# Patient Record
Sex: Female | Born: 1960 | Race: White | Hispanic: No | Marital: Married | Smoking: Never smoker
Health system: Southern US, Community
[De-identification: ages and names within clinical notes are randomized; demographics above are authoritative.]

## PROBLEM LIST (undated history)

## (undated) DIAGNOSIS — M1990 Unspecified osteoarthritis, unspecified site: Secondary | ICD-10-CM

## (undated) HISTORY — PX: LITHOTRIPSY: SUR834

---

## 2014-01-01 DIAGNOSIS — F5101 Primary insomnia: Secondary | ICD-10-CM | POA: Insufficient documentation

## 2014-01-01 DIAGNOSIS — M545 Low back pain, unspecified: Secondary | ICD-10-CM | POA: Insufficient documentation

## 2014-01-01 DIAGNOSIS — G8929 Other chronic pain: Secondary | ICD-10-CM | POA: Insufficient documentation

## 2014-02-02 DIAGNOSIS — E7800 Pure hypercholesterolemia, unspecified: Secondary | ICD-10-CM | POA: Insufficient documentation

## 2014-02-14 DIAGNOSIS — Z0189 Encounter for other specified special examinations: Secondary | ICD-10-CM | POA: Insufficient documentation

## 2014-04-24 ENCOUNTER — Ambulatory Visit: Payer: Self-pay | Admitting: Family Medicine

## 2014-05-21 ENCOUNTER — Ambulatory Visit: Payer: Self-pay | Admitting: Family Medicine

## 2014-06-22 DIAGNOSIS — D126 Benign neoplasm of colon, unspecified: Secondary | ICD-10-CM | POA: Insufficient documentation

## 2014-08-14 ENCOUNTER — Ambulatory Visit: Payer: Self-pay | Admitting: Family Medicine

## 2014-08-15 DIAGNOSIS — M818 Other osteoporosis without current pathological fracture: Secondary | ICD-10-CM | POA: Insufficient documentation

## 2016-09-17 ENCOUNTER — Other Ambulatory Visit: Payer: Self-pay | Admitting: Orthopedic Surgery

## 2016-09-17 DIAGNOSIS — M1712 Unilateral primary osteoarthritis, left knee: Secondary | ICD-10-CM

## 2016-09-17 DIAGNOSIS — M2392 Unspecified internal derangement of left knee: Secondary | ICD-10-CM

## 2016-09-17 DIAGNOSIS — M2352 Chronic instability of knee, left knee: Secondary | ICD-10-CM

## 2016-10-14 ENCOUNTER — Ambulatory Visit
Admission: RE | Admit: 2016-10-14 | Discharge: 2016-10-14 | Disposition: A | Discharge status: Home / Self Care | Payer: BLUE CROSS/BLUE SHIELD | Source: Ambulatory Visit | Attending: Orthopedic Surgery | Admitting: Orthopedic Surgery

## 2016-10-14 DIAGNOSIS — M7122 Synovial cyst of popliteal space [Baker], left knee: Secondary | ICD-10-CM | POA: Diagnosis not present

## 2016-10-14 DIAGNOSIS — X58XXXA Exposure to other specified factors, initial encounter: Secondary | ICD-10-CM | POA: Diagnosis not present

## 2016-10-14 DIAGNOSIS — M1712 Unilateral primary osteoarthritis, left knee: Secondary | ICD-10-CM | POA: Diagnosis present

## 2016-10-14 DIAGNOSIS — M25462 Effusion, left knee: Secondary | ICD-10-CM | POA: Diagnosis not present

## 2016-10-14 DIAGNOSIS — S83232A Complex tear of medial meniscus, current injury, left knee, initial encounter: Secondary | ICD-10-CM | POA: Diagnosis not present

## 2016-10-14 DIAGNOSIS — M2392 Unspecified internal derangement of left knee: Secondary | ICD-10-CM

## 2016-10-14 DIAGNOSIS — M2352 Chronic instability of knee, left knee: Secondary | ICD-10-CM | POA: Diagnosis present

## 2016-11-10 DIAGNOSIS — M23204 Derangement of unspecified medial meniscus due to old tear or injury, left knee: Secondary | ICD-10-CM | POA: Insufficient documentation

## 2016-11-10 DIAGNOSIS — M25562 Pain in left knee: Secondary | ICD-10-CM | POA: Insufficient documentation

## 2016-11-20 ENCOUNTER — Other Ambulatory Visit: Payer: Self-pay | Admitting: Family Medicine

## 2016-11-20 DIAGNOSIS — Z1239 Encounter for other screening for malignant neoplasm of breast: Secondary | ICD-10-CM

## 2016-12-01 ENCOUNTER — Encounter: Payer: Self-pay | Admitting: Anesthesiology

## 2016-12-04 ENCOUNTER — Ambulatory Visit: Payer: BLUE CROSS/BLUE SHIELD | Admitting: Anesthesiology

## 2016-12-04 ENCOUNTER — Ambulatory Visit
Admission: RE | Admit: 2016-12-04 | Discharge: 2016-12-04 | Disposition: A | Discharge status: Home / Self Care | Payer: BLUE CROSS/BLUE SHIELD | Source: Ambulatory Visit | Attending: Unknown Physician Specialty | Admitting: Unknown Physician Specialty

## 2016-12-04 ENCOUNTER — Encounter
Admission: RE | Disposition: A | Discharge status: Home / Self Care | Payer: Self-pay | Source: Ambulatory Visit | Attending: Unknown Physician Specialty

## 2016-12-04 DIAGNOSIS — X58XXXA Exposure to other specified factors, initial encounter: Secondary | ICD-10-CM | POA: Diagnosis not present

## 2016-12-04 DIAGNOSIS — Y929 Unspecified place or not applicable: Secondary | ICD-10-CM | POA: Diagnosis not present

## 2016-12-04 DIAGNOSIS — S83242A Other tear of medial meniscus, current injury, left knee, initial encounter: Secondary | ICD-10-CM | POA: Insufficient documentation

## 2016-12-04 HISTORY — PX: MENISECTOMY: SHX5181

## 2016-12-04 HISTORY — PX: KNEE ARTHROSCOPY: SHX127

## 2016-12-04 HISTORY — DX: Unspecified osteoarthritis, unspecified site: M19.90

## 2016-12-04 SURGERY — ARTHROSCOPY, KNEE
Anesthesia: General | Laterality: Left | Wound class: Clean

## 2016-12-04 MED ORDER — LIDOCAINE HCL (CARDIAC) 20 MG/ML IV SOLN
INTRAVENOUS | Status: DC | PRN
  Administered 2016-12-04: 50 mg via INTRATRACHEAL

## 2016-12-04 MED ORDER — OXYCODONE HCL 5 MG/5ML PO SOLN: 5.0000 mg | Freq: Once | ORAL | Status: AC | PRN

## 2016-12-04 MED ORDER — PROPOFOL 10 MG/ML IV BOLUS
INTRAVENOUS | Status: DC | PRN
  Administered 2016-12-04: 200 mg via INTRAVENOUS

## 2016-12-04 MED ORDER — SCOPOLAMINE 1 MG/3DAYS TD PT72
1.0000 | MEDICATED_PATCH | Freq: Once | TRANSDERMAL | Status: DC
  Administered 2016-12-04: 1.5 mg via TRANSDERMAL

## 2016-12-04 MED ORDER — ONDANSETRON HCL 4 MG/2ML IJ SOLN
INTRAMUSCULAR | Status: DC | PRN
  Administered 2016-12-04: 4 mg via INTRAVENOUS

## 2016-12-04 MED ORDER — DEXAMETHASONE SODIUM PHOSPHATE 4 MG/ML IJ SOLN
INTRAMUSCULAR | Status: DC | PRN
  Administered 2016-12-04: 4 mg via INTRAVENOUS

## 2016-12-04 MED ORDER — ACETAMINOPHEN 10 MG/ML IV SOLN
1000.0000 mg | Freq: Once | INTRAVENOUS | Status: AC
  Administered 2016-12-04: 1000 mg via INTRAVENOUS

## 2016-12-04 MED ORDER — FENTANYL CITRATE (PF) 100 MCG/2ML IJ SOLN
25.0000 ug | INTRAMUSCULAR | Status: DC | PRN
Start: 2016-12-04 — End: 2016-12-04

## 2016-12-04 MED ORDER — NORCO 5-325 MG PO TABS: 1.0000 | ORAL_TABLET | ORAL | 0 refills | Status: DC | PRN

## 2016-12-04 MED ORDER — ROPIVACAINE HCL 5 MG/ML IJ SOLN
INTRAMUSCULAR | Status: DC | PRN
  Administered 2016-12-04: 19 mL

## 2016-12-04 MED ORDER — MIDAZOLAM HCL 5 MG/5ML IJ SOLN
INTRAMUSCULAR | Status: DC | PRN
  Administered 2016-12-04: 2 mg via INTRAVENOUS

## 2016-12-04 MED ORDER — FENTANYL CITRATE (PF) 100 MCG/2ML IJ SOLN
INTRAMUSCULAR | Status: DC | PRN
  Administered 2016-12-04 (×2): 50 ug via INTRAVENOUS

## 2016-12-04 MED ORDER — ONDANSETRON HCL 4 MG/2ML IJ SOLN: 4.0000 mg | Freq: Once | INTRAMUSCULAR | Status: DC | PRN

## 2016-12-04 MED ORDER — OXYCODONE HCL 5 MG PO TABS
5.0000 mg | ORAL_TABLET | Freq: Once | ORAL | Status: AC | PRN
  Administered 2016-12-04: 5 mg via ORAL

## 2016-12-04 MED ORDER — LACTATED RINGERS IV SOLN
INTRAVENOUS | Status: DC
  Administered 2016-12-04: 09:00:00 via INTRAVENOUS

## 2016-12-04 SURGICAL SUPPLY — 52 items
ARTHROWAND PARAGON T2 (SURGICAL WAND)
BLADE ABRADER 4.5 (BLADE) ×4 IMPLANT
BLADE FULL RADIUS 3.5 (BLADE) ×4 IMPLANT
BLADE SHAVER 4.5X7 STR FR (MISCELLANEOUS) IMPLANT
BLADE SHAVER AGGRES 5.5  STR (CUTTER)
BLADE SHAVER AGGRES 5.5 STR (CUTTER) IMPLANT
BNDG ESMARK 6X12 TAN STRL LF (GAUZE/BANDAGES/DRESSINGS) IMPLANT
BUR 5.5 NOTCHBLASTER STR (BURR) IMPLANT
BUR ABRADER 4.0 W/FLUTE AQUA (MISCELLANEOUS) IMPLANT
BUR ABRADER 5.5 BLK (MISCELLANEOUS) IMPLANT
BUR ACROMIONIZER 4.0 (BURR) IMPLANT
BUR BR 5.5 WIDE MOUTH (BURR) IMPLANT
BUR RADIUS 3.5 (BURR) IMPLANT
BUR RADIUS 4.0X18.5 (BURR) IMPLANT
BUR ROUND 5.5 (BURR) IMPLANT
BURR 5.5 NOTCHBLASTER STR (BURR)
BURR ABRADER 4.0 W/FLUTE AQUA (MISCELLANEOUS)
BURR ABRADER 5.5 BLK (MISCELLANEOUS)
BURR ROUND 12 FLUTE 4.0MM (BURR) IMPLANT
CANN 8.5MMX72MM THREADED (CANNULA)
CANNULA THRD 8.5X72 DISP (CANNULA) IMPLANT
COVER LIGHT HANDLE UNIVERSAL (MISCELLANEOUS) ×8 IMPLANT
CUFF TOURN SGL QUICK 30 (MISCELLANEOUS)
CUFF TOURN SGL QUICK 34 (TOURNIQUET CUFF)
CUFF TRNQT CYL 34X4X40X1 (TOURNIQUET CUFF) IMPLANT
CUFF TRNQT CYL LO 30X4X (MISCELLANEOUS) IMPLANT
CUTTER SLOTTED WHISKER 4.0 (BURR) IMPLANT
DRAPE LEGGINS SURG 28X43 STRL (DRAPES) ×4 IMPLANT
DURAPREP 26ML APPLICATOR (WOUND CARE) ×4 IMPLANT
GAUZE SPONGE 4X4 12PLY STRL (GAUZE/BANDAGES/DRESSINGS) ×4 IMPLANT
GLOVE BIO SURGEON STRL SZ7.5 (GLOVE) ×4 IMPLANT
GLOVE BIO SURGEON STRL SZ8 (GLOVE) ×4 IMPLANT
GLOVE INDICATOR 8.0 STRL GRN (GLOVE) ×4 IMPLANT
GOWN STRL REIN 2XL XLG LVL4 (GOWN DISPOSABLE) ×8 IMPLANT
GOWN STRL REUS W/TWL 2XL LVL3 (GOWN DISPOSABLE) IMPLANT
IV LACTATED RINGER IRRG 3000ML (IV SOLUTION) ×4
IV LR IRRIG 3000ML ARTHROMATIC (IV SOLUTION) ×4 IMPLANT
KIT ROOM TURNOVER OR (KITS) ×4 IMPLANT
MANIFOLD 4PT FOR NEPTUNE1 (MISCELLANEOUS) ×4 IMPLANT
PACK ARTHROSCOPY KNEE (MISCELLANEOUS) ×4 IMPLANT
SET TUBE SUCT SHAVER OUTFL 24K (TUBING) ×4 IMPLANT
SOL PREP PVP 2OZ (MISCELLANEOUS) ×4
SOLUTION PREP PVP 2OZ (MISCELLANEOUS) ×2 IMPLANT
SUT ETHILON 3-0 FS-10 30 BLK (SUTURE) ×4
SUTURE EHLN 3-0 FS-10 30 BLK (SUTURE) ×2 IMPLANT
TAPE MICROFOAM 4IN (TAPE) ×4 IMPLANT
TUBING ARTHRO INFLOW-ONLY STRL (TUBING) ×4 IMPLANT
WAND ARTHRO PARAGON T2 (SURGICAL WAND) IMPLANT
WAND COBLATION FLOW 50 (SURGICAL WAND) ×4 IMPLANT
WAND HAND CNTRL MULTIVAC 50 (MISCELLANEOUS) IMPLANT
WAND HAND CNTRL MULTIVAC 90 (MISCELLANEOUS) IMPLANT
WAND MEGAVAC 90 (MISCELLANEOUS) IMPLANT

## 2016-12-04 NOTE — Op Note (Signed)
Patient: Misty Wilcox, Misty Wilcox.  Preoperative diagnosis: Torn medial meniscus left knee  Postop diagnosis: Same plus grade 2 medial femoral chondral changes  Operation: Arthroscopic partial medial meniscectomy left knee plus chondral debridement  Surgeon: Vilinda Flake, MD  Anesthesia: Gen.   History: Patient's had a long history of left knee pain.  The plain films revealed minimal medial compartment joint space narrowing .  The patient had an MRI which revealed a complex tear of the posterior horn of the medial meniscus.The patient was scheduled for surgery due to persistent discomfort despite conservative treatment.  The patient was taken the operating room where satisfactory general anesthesia was achieved. A tourniquet and leg holder were was applied to the left thigh. A well leg support was applied to the nonoperative extremity. The left knee was prepped and draped in usual fashion for an arthroscopic procedure. An inflow cannula was introduced superomedially. The joint was distended with lactated Ringer's. Scope was introduced through an inferolateral puncture wound and a probe through an inferomedial puncture wound. Inspection of the medial compartment revealed a complex tear of the posterior horn of the medial meniscus with a flap component. I went ahead and resected the tear using a combination of a motorized resector and basket biter. The remaining rim was contoured with an angled ArthroCare radiofrequency wand. There were some grade 2 medial femoral chondral changes that were debrided with a turbo whisker and then I coblated the more significant fibrillated areas with an ArthroCare radiofrequency wand. Inspection of the intercondylar notch revealed intact cruciates. Inspection of the the lateral compartment revealed no meniscal or chondral pathology.   Trochlear groove was inspected and appeared to be fairly smooth.  Inspection of the retropatellar surface revealed no significant  chondral pathology. I observed patella tracking from the inferolateral portal. The patella seemed to track fairly well.  The instruments were removed from the joint at this time. The puncture wounds were closed with 3-0 nylon in vertical mattress fashion. I injected each puncture wound with several cc of half percent Marcaine without epinephrine. Betadine was applied to the wounds followed by sterile dressing. An ice pack was applied to the right knee. The patient was awakened and transferred to the stretcher bed. The patient was taken to the recovery room in satisfactory condition.  The tourniquet was not inflated during the course of the procedure. Blood loss was negligible.

## 2016-12-04 NOTE — Anesthesia Postprocedure Evaluation (Signed)
Anesthesia Post Note  Patient: Aneta Mins Carley  Procedure(s) Performed: Procedure(s) (LRB): ARTHROSCOPY KNEE LEFT (Left) MENISECTOMY, partial  Patient location during evaluation: PACU Anesthesia Type: General Level of consciousness: awake and alert and oriented Pain management: satisfactory to patient Vital Signs Assessment: post-procedure vital signs reviewed and stable Respiratory status: spontaneous breathing, nonlabored ventilation and respiratory function stable Cardiovascular status: blood pressure returned to baseline and stable Postop Assessment: Adequate PO intake and No signs of nausea or vomiting Anesthetic complications: no    Raliegh Ip

## 2016-12-04 NOTE — Discharge Instructions (Signed)
[facility]  Kathrene Alu., M.D. [phone]   KNEE ARTHROSCOPY POST OPERATION INSTRUCTIONS:  PLEASE READ THESE INSTRUCTIONS ABOUT POST OPERATION CARE. THEY WILL ANSWER MOST OF YOUR QUESTIONS.  You have been given a prescription for pain. Please take as directed for pain.  You can walk, keeping the knee slightly stiff-avoid doing too much bending the first day. (if ACL reconstruction is performed, keep brace locked in extension when walking.)  You will use crutches or cane if needed. Can weight bear as tolerated  Plan to take three to four days off from work. You can resume work when you are comfortable. (This can be a week or more, depending on the type of work you do.)  To reduce pain and swelling, place one to two pillows under the knee the first two or three days when sitting or lying. An ice pack may be placed on top of the area over the dressing. Instructions for making homemade icepack are as follow:  Flexible homemade alcohol water ice pack  2 cups water  1 cup rubbing alcohol  food coloring for the blue tint (optional)  2 zip-top bags - gallon-size  Mix the water and alcohol together in one of your zip-top bags and add food coloring. Release as much air as possible and seal the bag. Place in freezer for at least 12 hours.  The small incisions in your knee are closed with nylon stitches. They will be removed in the office.  The bulky dressing may be removed in the third day after surgery. (If ACL surgery-DO NOT REMOVE BANDAGES). Put a waterproof band-aid over each stitch. Do not put any creams or ointments on wounds. You may shower at this time, but change waterproof band-aids after showering. KEEP INCISIONS CLEAN AND DRY UNTIL YOU RETURN TO THE OFFICE.  Sometimes the operative area remains somewhat painful and swollen for several weeks. This is usually nothing to worry about, but call if you have any excessive symptoms, especially  fever. It is not unusual to have a low grade fever of 99 degrees for the first few days. If persist after 3-4 days call the office. It is not uncommon for the pain to be a little worse on the third day after surgery.  Begin doing gentle exercises right away. They will be limited by the amount of pain and swelling you have.  Exercising will reduce the swelling, increase motion, and prevent muscle weakness. Exercises: Straight leg raising and gentle knee bending.  Take a 325 milligram aspirin or Bufferin tablet twice a day for 2 weeks after meals or milk. This along with elevation will help reduce the possibility of phlebitis in your operated leg.  Avoid strenuous athletics for a minimum of 4 to 6 weeks after arthroscopic surgery (approximately five months if ACL surgery).  If the surgery included ACL reconstruction the brace that is supplied to the extremity post surgery is to be locked in extension when you are asleep and is to be locked in extension when you are ambulating. It can be unlocked for exercises or sitting.  Keep your post surgery appointment that has been made for you. If you do not remember the date call [phone]. Your follow up appointment should be between 7-10 days.  General Anesthesia, Adult, Care After These instructions provide you with information about caring for yourself after your procedure. Your health care provider may also give you more specific instructions. Your treatment has been planned according to current  medical practices, but problems sometimes occur. Call your health care provider if you have any problems or questions after your procedure. What can I expect after the procedure? After the procedure, it is common to have:  Vomiting.  A sore throat.  Mental slowness.  It is common to feel:  Nauseous.  Cold or shivery.  Sleepy.  Tired.  Sore or achy, even in parts of your body where you did not have surgery.  Follow these instructions at home: For at  least 24 hours after the procedure:  Do not: ? Participate in activities where you could fall or become injured. ? Drive. ? Use heavy machinery. ? Drink alcohol. ? Take sleeping pills or medicines that cause drowsiness. ? Make important decisions or sign legal documents. ? Take care of children on your own.  Rest. Eating and drinking  If you vomit, drink water, juice, or soup when you can drink without vomiting.  Drink enough fluid to keep your urine clear or pale yellow.  Make sure you have little or no nausea before eating solid foods.  Follow the diet recommended by your health care provider. General instructions  Have a responsible adult stay with you until you are awake and alert.  Return to your normal activities as told by your health care provider. Ask your health care provider what activities are safe for you.  Take over-the-counter and prescription medicines only as told by your health care provider.  If you smoke, do not smoke without supervision.  Keep all follow-up visits as told by your health care provider. This is important. Contact a health care provider if:  You continue to have nausea or vomiting at home, and medicines are not helpful.  You cannot drink fluids or start eating again.  You cannot urinate after 8-12 hours.  You develop a skin rash.  You have fever.  You have increasing redness at the site of your procedure. Get help right away if:  You have difficulty breathing.  You have chest pain.  You have unexpected bleeding.  You feel that you are having a life-threatening or urgent problem. This information is not intended to replace advice given to you by your health care provider. Make sure you discuss any questions you have with your health care provider. Document Released: 08/10/2000 Document Revised: 10/07/2015 Document Reviewed: 04/18/2015 Elsevier Interactive Patient Education  2018 [employer].  Scopolamine skin  patches REMOVE PATCH IN 72 HOURS AND [place] HANDS IMMEDIATELY.  What is this medicine? SCOPOLAMINE (skoe POL a meen) is used to prevent nausea and vomiting caused by motion sickness, anesthesia and surgery. This medicine may be used for other purposes; ask your health care provider or pharmacist if you have questions. COMMON BRAND NAME(S): Transderm Scop What should I tell my health care provider before I take this medicine? They need to know if you have any of these conditions: -glaucoma -kidney or liver disease -an unusual or allergic reaction (especially skin allergy) to scopolamine, atropine, other medicines, foods, dyes, or preservatives -pregnant or trying to get pregnant -breast-feeding How should I use this medicine? This medicine is for external use only. Follow the directions on the prescription label. One patch contains enough medicine to prevent motion sickness for up to 3 days. Apply the patch at least 4 hours before you need it and only wear one disc at a time. Choose an area behind the ear, that is clean, dry, hairless and free from any cuts or irritation. Wipe the area with a  clean dry tissue. Peel off the plastic backing of the skin patch, trying not to touch the adhesive side with your hands. Do not cut the patches. Firmly apply to the area you have chosen, with the metallic side of the patch to the skin and the tan-colored side showing. Once firmly in place, wash your hands well with soap and water. Remove the disc after 3 days, or sooner if you no longer need it. After removing the patch, wash your hands and the area behind your ear thoroughly with soap and water. The patch will still contain some medicine after use. To avoid accidental contact or ingestion by children or pets, fold the used patch in half with the sticky side together and throw away in the trash out of the reach of children and pets. If you need to use a second patch after you remove the first, place it behind the  other ear. Talk to your pediatrician regarding the use of this medicine in children. Special care may be needed. Overdosage: If you think you have taken too much of this medicine contact a poison control center or emergency room at once. NOTE: This medicine is only for you. Do not share this medicine with others. What if I miss a dose? Make sure you apply the patch at least 4 hours before you need it. You can apply it the night before traveling. What may interact with this medicine? -benztropine -bethanechol -medicines for anxiety or sleeping problems like diazepam or temazepam -medicines for hay fever and other allergies -medicines for mental depression -muscle relaxants This list may not describe all possible interactions. Give your health care provider a list of all the medicines, herbs, non-prescription drugs, or dietary supplements you use. Also tell them if you smoke, drink alcohol, or use illegal drugs. Some items may interact with your medicine. What should I watch for while using this medicine? Keep the patch dry, if possible, to prevent it from falling off. Limited contact with water, however, as in bathing or swimming, will not affect the system. If the patch falls off, throw it away and put a new one behind the other ear. You may get drowsy or dizzy. Do not drive, use machinery, or do anything that needs mental alertness until you know how this medicine affects you. Do not stand or sit up quickly, especially if you are an older patient. This reduces the risk of dizzy or fainting spells. Alcohol may interfere with the effect of this medicine. Avoid alcoholic drinks. Your mouth may get dry. Chewing sugarless gum or sucking hard candy, and drinking plenty of water may help. Contact your doctor if the problem does not go away or is severe. This medicine may cause dry eyes and blurred vision. If you wear contact lenses you may feel some discomfort. Lubricating drops may help. See your eye  doctor if the problem does not go away or is severe. If you are going to have a magnetic resonance imaging (MRI) procedure, tell your MRI technician if you have this patch on your body. It must be removed before a MRI. What side effects may I notice from receiving this medicine? Side effects that you should report to your doctor or health care professional as soon as possible: -agitation, nervousness, confusion -blurred vision and other eye problems -dizziness, drowsiness -eye pain or redness in the whites of the eye -hallucinations -pain or difficulty passing urine -skin rash, itching -vomiting Side effects that usually do not require medical attention (report to  your doctor or health care professional if they continue or are bothersome): -headache -nausea This list may not describe all possible side effects. Call your doctor for medical advice about side effects. You may report side effects to FDA at 1-800-FDA-1088. Where should I keep my medicine? Keep out of the reach of children. Store at room temperature between 20 and 25 degrees C (68 and 77 degrees F). Throw away any unused medicine after the expiration date. When you remove a patch, fold it and throw it in the trash as described above. NOTE: This sheet is a summary. It may not cover all possible information. If you have questions about this medicine, talk to your doctor, pharmacist, or health care provider.  2018 Elsevier/Gold Standard (2011-10-01 13:31:48)

## 2016-12-04 NOTE — H&P (Signed)
  H and P reviewed. No changes. Uploaded at later date. 

## 2016-12-04 NOTE — Anesthesia Procedure Notes (Signed)
Procedure Name: LMA Insertion Date/Time: 12/04/2016 9:23 AM Performed by: Londell Moh Pre-anesthesia Checklist: Patient identified, Emergency Drugs available, Suction available, Timeout performed and Patient being monitored Patient Re-evaluated:Patient Re-evaluated prior to induction Oxygen Delivery Method: Circle system utilized Preoxygenation: Pre-oxygenation with 100% oxygen Induction Type: IV induction LMA: LMA inserted LMA Size: 4.0 Number of attempts: 1 Placement Confirmation: positive ETCO2 and breath sounds checked- equal and bilateral Tube secured with: Tape

## 2016-12-04 NOTE — Anesthesia Preprocedure Evaluation (Signed)
Anesthesia Evaluation  Patient identified by MRN, date of birth, ID band Patient awake    Reviewed: Allergy & Precautions, H&P , NPO status , Patient's Chart, lab work & pertinent test results  Airway Mallampati: II  TM Distance: >3 FB Neck ROM: full    Dental no notable dental hx.    Pulmonary    Pulmonary exam normal breath sounds clear to auscultation       Cardiovascular Normal cardiovascular exam Rhythm:regular Rate:Normal     Neuro/Psych    GI/Hepatic   Endo/Other    Renal/GU      Musculoskeletal   Abdominal   Peds  Hematology   Anesthesia Other Findings   Reproductive/Obstetrics                             Anesthesia Physical Anesthesia Plan  ASA: II  Anesthesia Plan: General   Post-op Pain Management:    Induction:   PONV Risk Score and Plan: 3 and Ondansetron, Dexamethasone, Propofol, Midazolam and Scopolamine patch - Pre-op  Airway Management Planned:   Additional Equipment:   Intra-op Plan:   Post-operative Plan:   Informed Consent: I have reviewed the patients History and Physical, chart, labs and discussed the procedure including the risks, benefits and alternatives for the proposed anesthesia with the patient or authorized representative who has indicated his/her understanding and acceptance.     Plan Discussed with: CRNA  Anesthesia Plan Comments:         Anesthesia Quick Evaluation

## 2016-12-04 NOTE — Transfer of Care (Signed)
Immediate Anesthesia Transfer of Care Note  Patient: Misty Wilcox  Procedure(s) Performed: Procedure(s) with comments: ARTHROSCOPY KNEE LEFT (Left) - ROB BAGWELL MENISECTOMY, partial  Patient Location: PACU  Anesthesia Type: General  Level of Consciousness: awake, alert  and patient cooperative  Airway and Oxygen Therapy: Patient Spontanous Breathing and Patient connected to supplemental oxygen  Post-op Assessment: Post-op Vital signs reviewed, Patient's Cardiovascular Status Stable, Respiratory Function Stable, Patent Airway and No signs of Nausea or vomiting  Post-op Vital Signs: Reviewed and stable  Complications: No apparent anesthesia complications

## 2016-12-07 ENCOUNTER — Encounter: Payer: Self-pay | Admitting: Unknown Physician Specialty

## 2017-06-09 ENCOUNTER — Other Ambulatory Visit: Payer: Self-pay | Admitting: Medical Oncology

## 2017-06-09 DIAGNOSIS — Z780 Asymptomatic menopausal state: Secondary | ICD-10-CM

## 2018-07-18 ENCOUNTER — Other Ambulatory Visit: Payer: Self-pay

## 2018-07-18 ENCOUNTER — Ambulatory Visit
Admission: EM | Admit: 2018-07-18 | Discharge: 2018-07-18 | Disposition: A | Discharge status: Home / Self Care | Payer: BLUE CROSS/BLUE SHIELD | Attending: Family Medicine | Admitting: Family Medicine

## 2018-07-18 ENCOUNTER — Encounter: Payer: Self-pay | Admitting: Emergency Medicine

## 2018-07-18 DIAGNOSIS — N3001 Acute cystitis with hematuria: Secondary | ICD-10-CM | POA: Diagnosis not present

## 2018-07-18 LAB — URINALYSIS, COMPLETE (UACMP) WITH MICROSCOPIC
Bilirubin Urine: NEGATIVE
Glucose, UA: NEGATIVE mg/dL
NITRITE: NEGATIVE
Protein, ur: 100 mg/dL — AB
RBC / HPF: >50 RBC/hpf (ref 0–5)
SPECIFIC GRAVITY, URINE: 1.02 (ref 1.005–1.030)
Squamous Epithelial / LPF: NONE SEEN (ref 0–5)
WBC, UA: >50 WBC/hpf (ref 0–5)
pH: 6 (ref 5.0–8.0)

## 2018-07-18 MED ORDER — CEPHALEXIN 500 MG PO CAPS: 500.0000 mg | ORAL_CAPSULE | Freq: Two times a day (BID) | ORAL | 0 refills | Status: DC

## 2018-07-18 NOTE — Discharge Instructions (Signed)
Antibiotic as prescribed.  Take care  Dr. Elverta Dimiceli  

## 2018-07-18 NOTE — ED Provider Notes (Signed)
[facility]    CSN: [id number] Arrival date & time: 07/18/18  0901  History   Chief Complaint Chief Complaint  Patient presents with  . Dysuria   HPI  58 year old female presents with concerns of UTI.  Patient reports that she has had burning with urination for the past 4 days.  She states this started on Thursday.  She reports urinary urgency and frequency.  Patient reports that she has increased her water intake and has been drinking cranberry juice with improvement.  However, her symptoms have not fully resolved.  She reports that she has some abdominal pain but this is now resolved.  No back pain.  No known exacerbating factors.  No other associated symptoms.  No other complaints.  PMH, Surgical Hx, Family Hx, Social History reviewed and updated as below.  Past Medical History:  Diagnosis Date  . Arthritis    left knee   Past Surgical History:  Procedure Laterality Date  . CESAREAN SECTION    . KNEE ARTHROSCOPY Left 12/04/2016   Procedure: ARTHROSCOPY KNEE LEFT;  Surgeon: Leanor Kail, MD;  Location: [place];  Service: Orthopedics;  Laterality: Left;  ROB BAGWELL  . LITHOTRIPSY     58 years of age  . MENISECTOMY  12/04/2016   Procedure: MENISECTOMY, partial;  Surgeon: Leanor Kail, MD;  Location: [place];  Service: Orthopedics;;   OB History   No obstetric history on file.    Home Medications    Prior to Admission medications   Medication Sig Start Date End Date Taking? Authorizing Provider  cephALEXin (KEFLEX) 500 MG capsule Take 1 capsule (500 mg total) by mouth 2 (two) times daily. 07/18/18   Coral Spikes, DO  Ergocalciferol (VITAMIN D2 PO) Take 1 tablet by mouth.    [provider]    Family History Family History  Problem Relation Age of Onset  . Cancer Mother        brain    Social History Social History   Tobacco Use  . Smoking status: Never Smoker  . Smokeless tobacco: Never Used  Substance Use  Topics  . Alcohol use: Yes    Alcohol/week: 3.0 standard drinks    Types: 3 Glasses of wine per week    Comment: per week  . Drug use: Not on file     Allergies   Patient has no known allergies.   Review of Systems Review of Systems  Constitutional: Negative for fever.  Gastrointestinal: Positive for abdominal pain.  Genitourinary: Positive for dysuria, frequency and urgency. Negative for flank pain.  Musculoskeletal: Negative for back pain.   Physical Exam Triage Vital Signs ED Triage Vitals  Enc Vitals Group     BP 07/18/18 0931 124/75     Pulse Rate 07/18/18 0931 70     Resp 07/18/18 0931 16     Temp 07/18/18 0931 98.5 F (36.9 C)     Temp Source 07/18/18 0931 Oral     SpO2 07/18/18 0931 99 %     Weight 07/18/18 0928 165 lb (74.8 kg)     Height 07/18/18 0928 4\' 11"  (1.499 m)     Head Circumference --      Peak Flow --      Pain Score 07/18/18 0928 0     Pain Loc --      Pain Edu? --      Excl. in [place]? --    Updated Vital Signs BP 124/75 (BP Location: Left Arm)  Pulse 70   Temp 98.5 F (36.9 C) (Oral)   Resp 16   Ht 4\' 11"  (1.499 m)   Wt 74.8 kg   SpO2 99%   BMI 33.33 kg/m   Visual Acuity Right Eye Distance:   Left Eye Distance:   Bilateral Distance:    Right Eye Near:   Left Eye Near:    Bilateral Near:     Physical Exam Constitutional:      General: She is not in acute distress.    Appearance: Normal appearance. She is not ill-appearing.  HENT:     Head: Normocephalic and atraumatic.  Eyes:     General: No scleral icterus.    Conjunctiva/sclera: Conjunctivae normal.  Cardiovascular:     Rate and Rhythm: Normal rate and regular rhythm.  Pulmonary:     Effort: Pulmonary effort is normal.     Breath sounds: Normal breath sounds.  Abdominal:     General: There is no distension.     Palpations: Abdomen is soft.     Tenderness: There is no abdominal tenderness.  Neurological:     Mental Status: She is alert.  Psychiatric:        Mood and  Affect: Mood normal.        Behavior: Behavior normal.    UC Treatments / Results  Labs (all labs ordered are listed, but only abnormal results are displayed) Labs Reviewed  URINALYSIS, COMPLETE (UACMP) WITH MICROSCOPIC - Abnormal; Notable for the following components:      Result Value   APPearance CLOUDY (*)    Hgb urine dipstick MODERATE (*)    Ketones, ur TRACE (*)    Protein, ur 100 (*)    Leukocytes,Ua LARGE (*)    Bacteria, UA FEW (*)    All other components within normal limits  URINE CULTURE    EKG None  Radiology No results found.  Procedures Procedures (including critical care time)  Medications Ordered in UC Medications - No data to display  Initial Impression / Assessment and Plan / UC Course  I have reviewed the triage vital signs and the nursing notes.  Pertinent labs & imaging results that were available during my care of the patient were reviewed by me and considered in my medical decision making (see chart for details).    58 year old female presents with UTI.  Sending culture.  Starting on Keflex.  Final Clinical Impressions(s) / UC Diagnoses   Final diagnoses:  Acute cystitis with hematuria     Discharge Instructions     Antibiotic as prescribed.  Take care  Dr. Lacinda Axon     ED Prescriptions    Medication Sig Dispense Auth. Provider   cephALEXin (KEFLEX) 500 MG capsule Take 1 capsule (500 mg total) by mouth 2 (two) times daily. 14 capsule Coral Spikes, DO     Controlled Substance Prescriptions [place] Controlled Substance Registry consulted? Not Applicable   Coral Spikes, DO 07/18/18 1043

## 2018-07-18 NOTE — ED Triage Notes (Signed)
Pt c/o dysuria. Started about 3-4 days ago. She noticed light pink color when she wiped this morning and thought it could be blood. Denies lower back pain, fever, or chills.

## 2018-07-19 LAB — URINE CULTURE: Culture: <10000 — AB

## 2018-09-14 IMAGING — MR MR KNEE*L* W/O CM
7 series · 40 of 40 positions shown · non-contrast
Comparison: None.

CLINICAL DATA: Five month history of knee pain and swelling. Left
knee instability. Primary osteoarthritis of the left knee.

EXAM:
MRI OF THE LEFT KNEE WITHOUT CONTRAST
TECHNIQUE: Multiplanar, multisequence MR imaging of the knee was performed. No
intravenous contrast was administered.

[Series 3: PD fat-sat · axial · 3.0mm · 0.62mm/px · z∈[-68,+44]mm · 6 of 35 slices shown (1 of 4)]
[im 1/35]
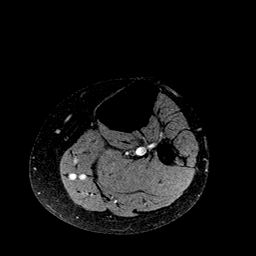
[im 7/35]
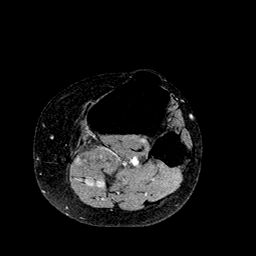
[im 14/35]
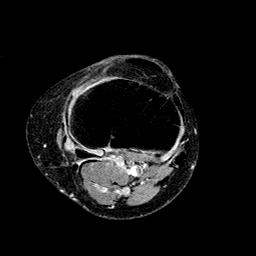
[im 21/35]
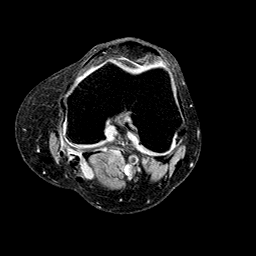
[im 28/35]
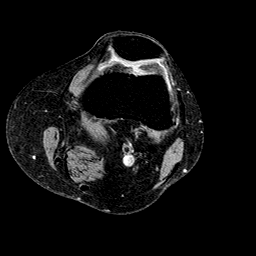
[im 35/35]
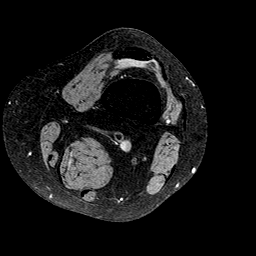

[Series 4: T2 fat-sat · axial · 3.0mm · 0.62mm/px · z∈[-68,+44]mm · 6 of 35 slices shown (1 of 2)]
[im 1/35]
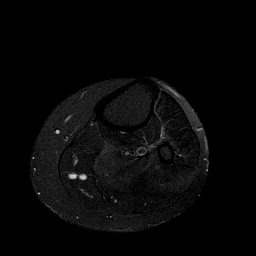
[im 7/35]
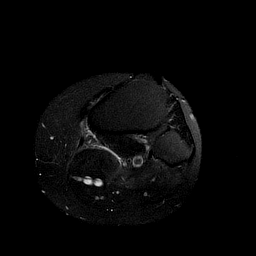
[im 14/35]
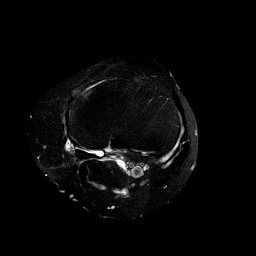
[im 21/35]
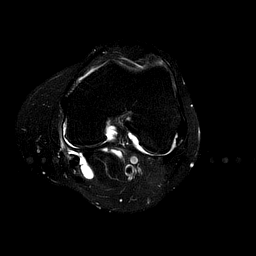
[im 28/35]
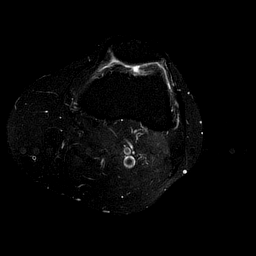
[im 35/35]
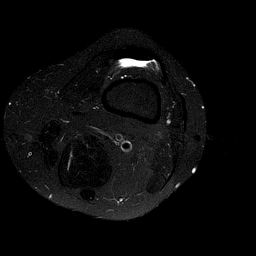

[Series 5: PD fat-sat · sagittal · 3.0mm · 0.62mm/px · 6 of 32 slices shown (2 of 4)]
[im 1/32]
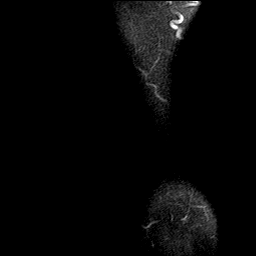
[im 7/32]
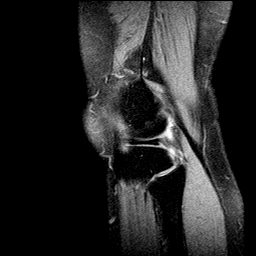
[im 13/32]
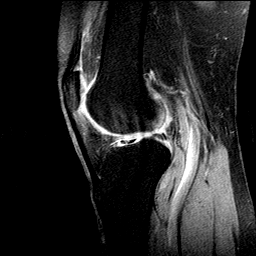
[im 19/32]
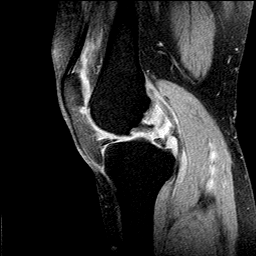
[im 25/32]
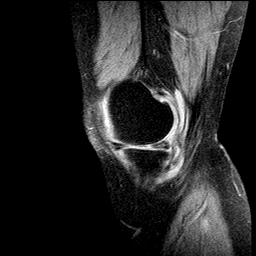
[im 32/32]
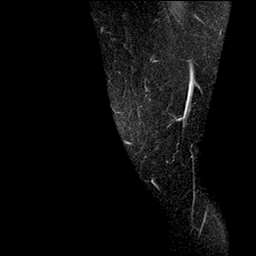

[Series 6: T2 fat-sat · coronal · 3.0mm · 0.62mm/px · 6 of 31 slices shown (2 of 2)]
[im 1/31]
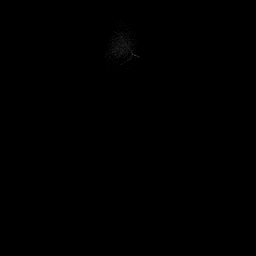
[im 7/31]
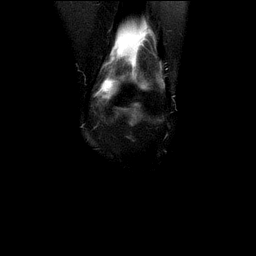
[im 13/31]
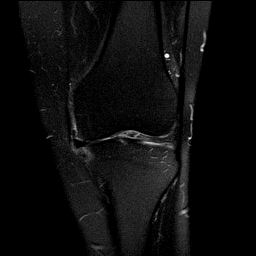
[im 19/31]
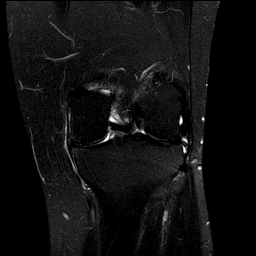
[im 25/31]
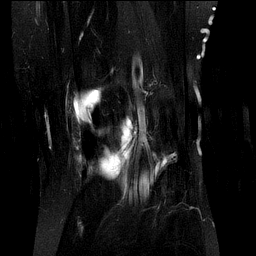
[im 31/31]
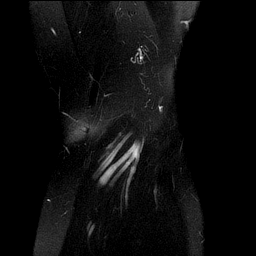

[Series 7: T1 · coronal · 3.0mm · 0.62mm/px · 6 of 31 slices shown]
[im 1/31]
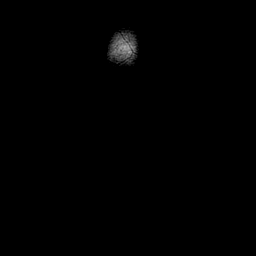
[im 7/31]
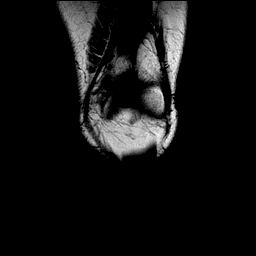
[im 13/31]
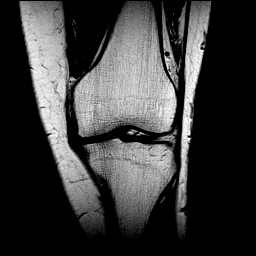
[im 19/31]
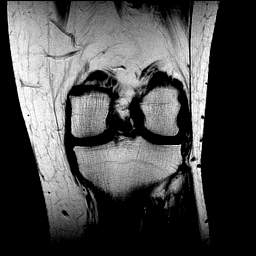
[im 25/31]
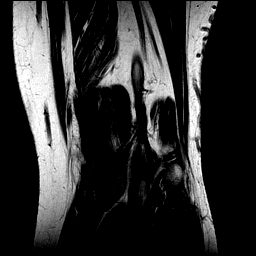
[im 31/31]
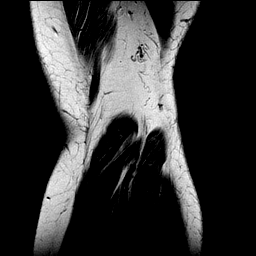

[Series 8: PD fat-sat · coronal · 3.0mm · 0.62mm/px · 6 of 30 slices shown (3 of 4)]
[im 1/30]
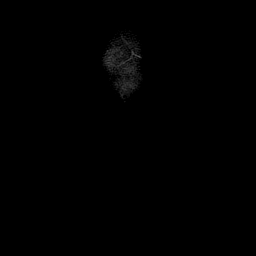
[im 6/30]
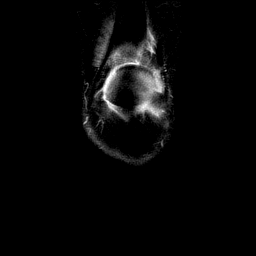
[im 12/30]
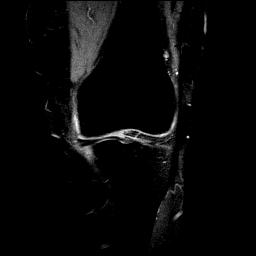
[im 18/30]
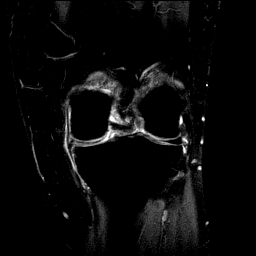
[im 24/30]
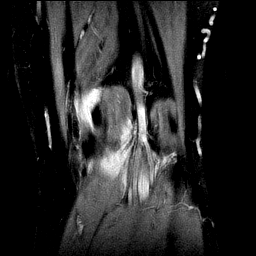
[im 30/30]
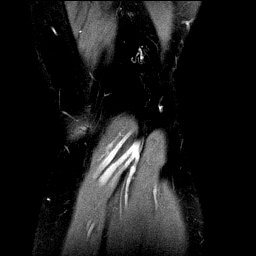

[Series 9: PD fat-sat · oblique · 2.0mm · 0.31mm/px · 4 of 19 slices shown (4 of 4)]
[im 1/19]
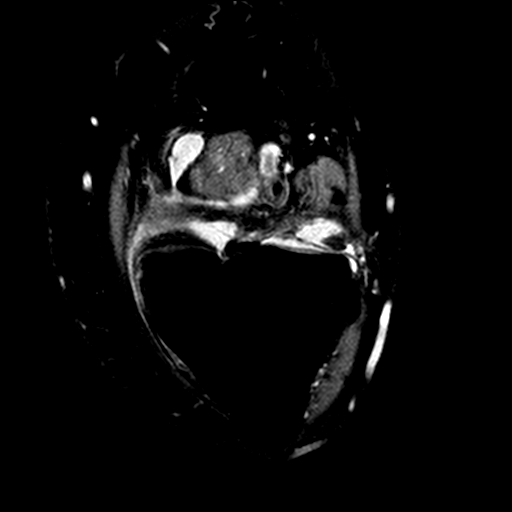
[im 7/19]
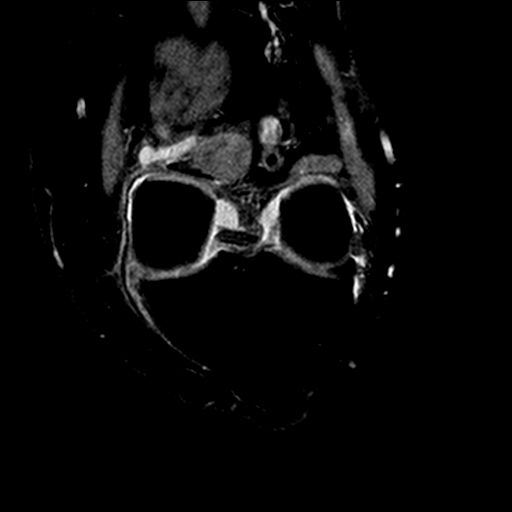
[im 13/19]
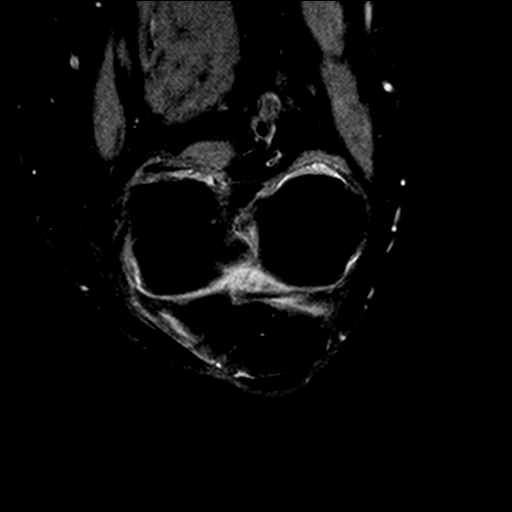
[im 19/19]
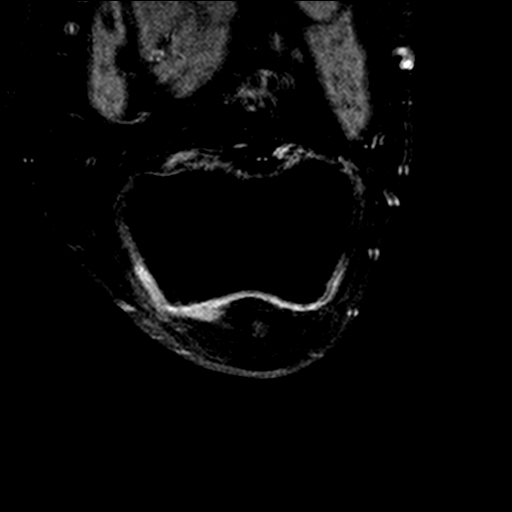

[40 of 40 positions shown; findings below may reference images not displayed]

FINDINGS: MENISCI

Medial meniscus: There is a complex tear of the midbody of the
medial meniscus with horizontal, oblique, and radial components to
the tear. The midbody is peripherally subluxed.

Lateral meniscus:  Intact.

LIGAMENTS

Cruciates:  Intact ACL and PCL.

Collaterals: Medial collateral ligament is intact. Lateral
collateral ligament complex is intact.

CARTILAGE

Patellofemoral:  Normal.

Medial:  Slight irregular thinning of the articular cartilage.

Lateral:  Normal.

Joint:  Small joint effusion.

Popliteal Fossa:  Small Baker's cyst.

Extensor Mechanism:  Intact quadriceps tendon and patellar tendon.

Bones: No focal marrow signal abnormality. No fracture or
dislocation.

Other: None.
IMPRESSION: 1. Complex tear of the midbody of the medial meniscus. Irregular
thinning of the articular cartilage in the medial compartment.
2. Small joint effusion with small Baker's cyst.

## 2020-05-20 ENCOUNTER — Other Ambulatory Visit: Payer: Self-pay

## 2020-05-20 ENCOUNTER — Ambulatory Visit: Payer: BC Managed Care – PPO | Admitting: Podiatry

## 2020-05-20 ENCOUNTER — Encounter: Payer: Self-pay | Admitting: Podiatry

## 2020-05-20 ENCOUNTER — Ambulatory Visit (INDEPENDENT_AMBULATORY_CARE_PROVIDER_SITE_OTHER): Payer: BC Managed Care – PPO

## 2020-05-20 DIAGNOSIS — M722 Plantar fascial fibromatosis: Secondary | ICD-10-CM

## 2020-05-20 MED ORDER — TRIAMCINOLONE ACETONIDE 40 MG/ML IJ SUSP
20.0000 mg | Freq: Once | INTRAMUSCULAR | Status: AC
  Administered 2020-05-20: 20 mg

## 2020-05-20 MED ORDER — METHYLPREDNISOLONE 4 MG PO TBPK: ORAL_TABLET | ORAL | 0 refills | Status: DC

## 2020-05-20 MED ORDER — MELOXICAM 15 MG PO TABS: 15.0000 mg | ORAL_TABLET | Freq: Every day | ORAL | 3 refills | Status: DC

## 2020-05-20 NOTE — Progress Notes (Signed)
  Subjective:  Patient ID: Misty Wilcox, female    DOB: [birth year],  MRN: [id number] HPI Chief Complaint  Patient presents with  . Foot Pain    Plantar heel right - aching x 2 months, AM pain, tried Ibuprofen PRN - no help  . New Patient (Initial Visit)    60 y.o. female presents with the above complaint.   ROS: Denies fever chills nausea vomiting muscle aches pains calf pain back pain chest pain shortness of breath.  Past Medical History:  Diagnosis Date  . Arthritis    left knee   Past Surgical History:  Procedure Laterality Date  . CESAREAN SECTION    . KNEE ARTHROSCOPY Left 12/04/2016   Procedure: ARTHROSCOPY KNEE LEFT;  Surgeon: Misty Sons, MD;  Location: [facility];  Service: Orthopedics;  Laterality: Left;  ROB BAGWELL  . LITHOTRIPSY     60 years of age  . MENISECTOMY  12/04/2016   Procedure: MENISECTOMY, partial;  Surgeon: Misty Sons, MD;  Location: [facility];  Service: Orthopedics;;    Current Outpatient Medications:  .  meloxicam (MOBIC) 15 MG tablet, Take 1 tablet (15 mg total) by mouth daily., Disp: 30 tablet, Rfl: 3 .  methylPREDNISolone (MEDROL DOSEPAK) 4 MG TBPK tablet, 6 day dose pack - take as directed, Disp: 21 tablet, Rfl: 0  No Known Allergies Review of Systems Objective:  There were no vitals filed for this visit.  General: Well developed, nourished, in no acute distress, alert and oriented x3   Dermatological: Skin is warm, dry and supple bilateral. Nails x 10 are well maintained; remaining integument appears unremarkable at this time. There are no open sores, no preulcerative lesions, no rash or signs of infection present.  Vascular: Dorsalis Pedis artery and Posterior Tibial artery pedal pulses are 2/4 bilateral with immedate capillary fill time. Pedal hair growth present. No varicosities and no lower extremity edema present bilateral.   Neruologic: Grossly intact via light touch bilateral. Vibratory intact via  tuning fork bilateral. Protective threshold with Semmes Wienstein monofilament intact to all pedal sites bilateral. Patellar and Achilles deep tendon reflexes 2+ bilateral. No Babinski or clonus noted bilateral.   Musculoskeletal: No gross boney pedal deformities bilateral. No pain, crepitus, or limitation noted with foot and ankle range of motion bilateral. Muscular strength 5/5 in all groups tested bilateral.  Pain on palpation medial calcaneal tubercle of the right heel.  No pain on direct medial and lateral compression of the calcaneus.  Gait: Unassisted, Nonantalgic.    Radiographs:  Radiographs taken today demonstrate an osseously mature individual foot is rectus no acute findings.  Soft tissue swelling at the plantar fashion calcaneal insertion site is noted with an increase in soft tissue density.  Assessment & Plan:   Assessment: Plantar fasciitis right.  Plan: Discussed etiology pathology conservative versus surgical therapies at this point started her on a Medrol Dosepak to be followed by meloxicam.  Injected her right heel today with 20 mg Kenalog 5 mg Marcaine.  Tolerated procedure well without complications.  Placed on plantar fascial brace and night splint discussed appropriate shoe gear stretching exercise ice therapy and shoe gear modifications.     [name] T. Wilcox, [place]

## 2020-05-20 NOTE — Patient Instructions (Signed)

## 2020-06-17 ENCOUNTER — Encounter: Payer: BC Managed Care – PPO | Admitting: Podiatry

## 2020-12-30 ENCOUNTER — Ambulatory Visit
Admission: EM | Admit: 2020-12-30 | Discharge: 2020-12-30 | Disposition: A | Discharge status: Home / Self Care | Payer: BC Managed Care – PPO

## 2020-12-30 ENCOUNTER — Other Ambulatory Visit: Payer: Self-pay

## 2020-12-30 DIAGNOSIS — R0789 Other chest pain: Secondary | ICD-10-CM | POA: Diagnosis not present

## 2020-12-30 NOTE — ED Provider Notes (Signed)
[facility]    CSN: [id number] Arrival date & time: 12/30/20  0959      History   Chief Complaint Chief Complaint  Patient presents with   Chest Pain    HPI Misty Wilcox is a 60 y.o. female.   Patient is a 60 year old female who presents with complaint of left chest discomfort.  Patient states initial pain was about a week ago after moving some furniture.  She stated it was worse with twisting.  She states the pain went away after couple of days but returned a couple days ago after helping her husband put on her leg breaks.  Patient has not taken any over-the-counter medications for the pain.  Patient denies any shortness of breath.   Past Medical History:  Diagnosis Date   Arthritis    left knee    Patient Active Problem List   Diagnosis Date Noted   Degenerative tear of medial meniscus of left knee 11/10/2016   Left knee pain 11/10/2016   Premature osteoporosis 08/15/2014   Adenomatous colon polyp 06/22/2014   Tests ordered 02/14/2014   Hypercholesterolemia 02/02/2014   Chronic low back pain 01/01/2014   Primary insomnia 01/01/2014    Past Surgical History:  Procedure Laterality Date   CESAREAN SECTION     KNEE ARTHROSCOPY Left 12/04/2016   Procedure: ARTHROSCOPY KNEE LEFT;  Surgeon: Leanor Kail, MD;  Location: [place];  Service: Orthopedics;  Laterality: Left;  ROB BAGWELL   LITHOTRIPSY     60 years of age   MENISECTOMY  12/04/2016   Procedure: MENISECTOMY, partial;  Surgeon: Leanor Kail, MD;  Location: [place];  Service: Orthopedics;;    OB History   No obstetric history on file.      Home Medications    Prior to Admission medications   Medication Sig Start Date End Date Taking? Authorizing Provider  meloxicam (MOBIC) 15 MG tablet Take 1 tablet (15 mg total) by mouth daily. 05/20/20   Hyatt, Max T, DPM  methylPREDNISolone (MEDROL DOSEPAK) 4 MG TBPK tablet 6 day dose pack - take as directed 05/20/20   Garrel Ridgel, DPM    Family History Family History  Problem Relation Age of Onset   Cancer Mother        brain    Social History Social History   Tobacco Use   Smoking status: Never   Smokeless tobacco: Never  Vaping Use   Vaping Use: Never used  Substance Use Topics   Alcohol use: Yes    Alcohol/week: 3.0 standard drinks    Types: 3 Glasses of wine per week    Comment: per week     Allergies   Patient has no known allergies.   Review of Systems Review of Systems as noted above in HPI.  Other systems reviewed and found to be negative   Physical Exam Triage Vital Signs ED Triage Vitals  Enc Vitals Group     BP 12/30/20 1007 (!) 156/79     Pulse Rate 12/30/20 1007 68     Resp 12/30/20 1007 18     Temp 12/30/20 1007 98.1 F (36.7 C)     Temp src --      SpO2 12/30/20 1007 98 %     Weight --      Height --      Head Circumference --      Peak Flow --      Pain Score 12/30/20 1006 7  Pain Loc --      Pain Edu? --      Excl. in [place]? --    No data found.  Updated Vital Signs BP (!) 156/79   Pulse 68   Temp 98.1 F (36.7 C)   Resp 18   SpO2 98%   Visual Acuity Right Eye Distance:   Left Eye Distance:   Bilateral Distance:    Right Eye Near:   Left Eye Near:    Bilateral Near:     Physical Exam Constitutional:      General: She is not in acute distress.    Appearance: She is well-developed and normal weight.  HENT:     Head: Normocephalic and atraumatic.  Cardiovascular:     Rate and Rhythm: Normal rate and regular rhythm.     Heart sounds: Normal heart sounds. No murmur heard. Pulmonary:     Effort: Pulmonary effort is normal. No tachypnea.     Breath sounds: Normal breath sounds. No stridor.  Chest:    Skin:    General: Skin is warm and dry.  Neurological:     Mental Status: She is alert.     UC Treatments / Results  Labs (all labs ordered are listed, but only abnormal results are displayed) Labs Reviewed - No data to  display  EKG Normal sinus rhythm.  Ventricular rate 66 bpm.  QTc 417 ms  Radiology No results found.  Procedures Procedures (including critical care time)  Medications Ordered in UC Medications - No data to display  Initial Impression / Assessment and Plan / UC Course  I have reviewed the triage vital signs and the nursing notes.  Pertinent labs & imaging results that were available during my care of the patient were reviewed by me and considered in my medical decision making (see chart for details).    Patient presents with left chest discomfort that began with moving heavy fracture.  Her symptoms improved but reoccurred after helping her husband put on a leg brace.  EKG is negative for any acute changes.  Her pain is reproducible with palpation and deep breath.  Patient given recommendations for her avoiding aggravating movements.  Warm compresses.  Over-the-counter medications for pain.  Final Clinical Impressions(s) / UC Diagnoses   Final diagnoses:  Chest wall pain     Discharge Instructions      -Again, the reproducible pain you are having is can be related to the muscles of the chest or chest wall themselves.  EKG was negative for any acute changes -Ibuprofen or Tylenol as needed for pain -Warm compresses as needed for discomfort -Would avoid aggravating movements until symptoms are resolved.     ED Prescriptions   None    PDMP not reviewed this encounter.   Luvenia Redden, PA-C 12/30/20 [phone]

## 2020-12-30 NOTE — Discharge Instructions (Addendum)
-  Again, the reproducible pain you are having is can be related to the muscles of the chest or chest wall themselves.  EKG was negative for any acute changes -Ibuprofen or Tylenol as needed for pain -Warm compresses as needed for discomfort -Would avoid aggravating movements until symptoms are resolved.

## 2020-12-30 NOTE — ED Triage Notes (Signed)
Pt presents with intermittent left chest pain that began last week, pt states she was moving heavy furniture last week

## 2024-04-22 ENCOUNTER — Encounter: Payer: Self-pay | Admitting: Emergency Medicine

## 2024-04-22 ENCOUNTER — Ambulatory Visit
Admission: EM | Admit: 2024-04-22 | Discharge: 2024-04-22 | Disposition: A | Discharge status: Home / Self Care | Attending: Physician Assistant | Admitting: Physician Assistant

## 2024-04-22 DIAGNOSIS — B370 Candidal stomatitis: Secondary | ICD-10-CM

## 2024-04-22 MED ORDER — NYSTATIN 100000 UNIT/ML MT SUSP: OROMUCOSAL | 0 refills | Status: AC

## 2024-04-22 NOTE — ED Triage Notes (Signed)
 Patient reports sores on her tongue and on the inside of her lips for the past 2-3 days.  Patient denies fevers.

## 2024-04-22 NOTE — ED Provider Notes (Signed)
 [facility]    CSN: [id number] Arrival date & time: 04/22/24  1119      History   Chief Complaint Chief Complaint  Patient presents with   Mouth Lesions    HPI Misty Wilcox is a 63 y.o. female presenting for whitish exudates in mouth that she noticed 2 to 3 days ago.  Reports the areas of concern are under her tongue and inside her lower lip.  Denies sore throat or difficulty swallowing.  Has never had anything like this before.  Does not use any corticosteroid inhalers and has not recently been on oral corticosteroids.  Patient is also not diabetic.  Non-smoker.  No treatment so far.  HPI  Past Medical History:  Diagnosis Date   Arthritis    left knee    Patient Active Problem List   Diagnosis Date Noted   Degenerative tear of medial meniscus of left knee 11/10/2016   Left knee pain 11/10/2016   Premature osteoporosis 08/15/2014   Adenomatous colon polyp 06/22/2014   Tests ordered 02/14/2014   Hypercholesterolemia 02/02/2014   Chronic low back pain 01/01/2014   Primary insomnia 01/01/2014    Past Surgical History:  Procedure Laterality Date   CESAREAN SECTION     KNEE ARTHROSCOPY Left 12/04/2016   Procedure: ARTHROSCOPY KNEE LEFT;  Surgeon: Maryl Barters, MD;  Location: [facility];  Service: Orthopedics;  Laterality: Left;  ROB BAGWELL   LITHOTRIPSY     63 years of age   MENISECTOMY  12/04/2016   Procedure: MENISECTOMY, partial;  Surgeon: Maryl Barters, MD;  Location: [facility];  Service: Orthopedics;;    OB History   No obstetric history on file.      Home Medications    Prior to Admission medications   Medication Sig Start Date End Date Taking? Authorizing Provider  nystatin  (MYCOSTATIN ) 100000 UNIT/ML suspension Take 5 ml PO, swish and retain in mouth for several seconds before spitting. Do this every 6 hours until 48 hours after symptoms have resolved 04/22/24  Yes Arvis Jolan NOVAK, PA-C    Family  History Family History  Problem Relation Age of Onset   Cancer Mother        brain    Social History Social History   Tobacco Use   Smoking status: Never   Smokeless tobacco: Never  Vaping Use   Vaping status: Never Used  Substance Use Topics   Alcohol use: Yes    Alcohol/week: 3.0 standard drinks of alcohol    Types: 3 Glasses of wine per week    Comment: per week   Drug use: Never     Allergies   Patient has no known allergies.   Review of Systems Review of Systems  Constitutional:  Negative for fatigue and fever.  HENT:  Positive for mouth sores. Negative for sore throat and trouble swallowing.      Physical Exam Triage Vital Signs ED Triage Vitals  Encounter Vitals Group     BP 04/22/24 1132 135/77     Girls Systolic BP Percentile --      Girls Diastolic BP Percentile --      Boys Systolic BP Percentile --      Boys Diastolic BP Percentile --      Pulse Rate 04/22/24 1132 75     Resp 04/22/24 1132 14     Temp 04/22/24 1132 97.8 F (36.6 C)     Temp Source 04/22/24 1132 Oral     SpO2 04/22/24 1132  100 %     Weight 04/22/24 1130 164 lb 14.5 oz (74.8 kg)     Height 04/22/24 1130 4' 11 (1.499 m)     Head Circumference --      Peak Flow --      Pain Score 04/22/24 1130 2     Pain Loc --      Pain Education --      Exclude from Growth Chart --    No data found.  Updated Vital Signs BP 135/77 (BP Location: Left Arm)   Pulse 75   Temp 97.8 F (36.6 C) (Oral)   Resp 14   Ht 4' 11 (1.499 m)   Wt 164 lb 14.5 oz (74.8 kg)   SpO2 100%   BMI 33.31 kg/m     Physical Exam Vitals and nursing note reviewed.  Constitutional:      General: She is not in acute distress.    Appearance: Normal appearance. She is not ill-appearing or toxic-appearing.  HENT:     Head: Normocephalic and atraumatic.     Nose: Nose normal.     Mouth/Throat:     Mouth: Mucous membranes are moist.     Pharynx: Oropharynx is clear.     Comments: Thick white exudates  underside and lateral tongue, inner lower lip Eyes:     General: No scleral icterus.       Right eye: No discharge.        Left eye: No discharge.     Conjunctiva/sclera: Conjunctivae normal.  Cardiovascular:     Rate and Rhythm: Normal rate.  Pulmonary:     Effort: Pulmonary effort is normal. No respiratory distress.  Musculoskeletal:     Cervical back: Neck supple.  Skin:    General: Skin is dry.  Neurological:     General: No focal deficit present.     Mental Status: She is alert. Mental status is at baseline.     Motor: No weakness.     Gait: Gait normal.  Psychiatric:        Mood and Affect: Mood normal.        Behavior: Behavior normal.      UC Treatments / Results  Labs (all labs ordered are listed, but only abnormal results are displayed) Labs Reviewed - No data to display  EKG   Radiology No results found.  Procedures Procedures (including critical care time)  Medications Ordered in UC Medications - No data to display  Initial Impression / Assessment and Plan / UC Course  I have reviewed the triage vital signs and the nursing notes.  Pertinent labs & imaging results that were available during my care of the patient were reviewed by me and considered in my medical decision making (see chart for details).   63 year old female presents for 1 to psych today provide mild for the past couple days.  Unknown trigger.  Evaluation is consistent with oral thrush.  Treating this time with nystatin  mouthwash.  Supportive care advised.  Reviewed PCP follow-up especially if symptoms or not improving.   Final Clinical Impressions(s) / UC Diagnoses   Final diagnoses:  Oral thrush   Discharge Instructions   None    ED Prescriptions     Medication Sig Dispense Auth. Provider   nystatin  (MYCOSTATIN ) 100000 UNIT/ML suspension Take 5 ml PO, swish and retain in mouth for several seconds before spitting. Do this every 6 hours until 48 hours after symptoms have  resolved 473 mL Arvis Jolan NOVAK, PA-C  PDMP not reviewed this encounter.   Arvis Jolan NOVAK, PA-C 04/22/24 1214
